# Patient Record
Sex: Male | Born: 2000 | Race: Asian | Hispanic: No | Marital: Single | Smoking: Never smoker
Health system: Southern US, Community
[De-identification: ages and names within clinical notes are randomized; demographics above are authoritative.]

## PROBLEM LIST (undated history)

## (undated) DIAGNOSIS — J302 Other seasonal allergic rhinitis: Secondary | ICD-10-CM

## (undated) DIAGNOSIS — R109 Unspecified abdominal pain: Secondary | ICD-10-CM

## (undated) DIAGNOSIS — K5909 Other constipation: Secondary | ICD-10-CM

## (undated) DIAGNOSIS — F419 Anxiety disorder, unspecified: Secondary | ICD-10-CM

## (undated) HISTORY — PX: CIRCUMCISION: SUR203

## (undated) HISTORY — DX: Unspecified abdominal pain: R10.9

## (undated) HISTORY — PX: ADENOIDECTOMY: SUR15

## (undated) HISTORY — DX: Other constipation: K59.09

---
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
## 2022-01-27 ENCOUNTER — Encounter: Payer: Self-pay | Admitting: Family Medicine

## 2022-01-27 ENCOUNTER — Ambulatory Visit: Payer: 59 | Admitting: Family Medicine

## 2022-01-27 ENCOUNTER — Telehealth: Payer: Self-pay

## 2022-01-27 VITALS — BP 114/66 | HR 67 | Temp 97.8°F | Resp 16 | Ht 66.75 in | Wt 115.0 lb

## 2022-01-27 DIAGNOSIS — Z8719 Personal history of other diseases of the digestive system: Secondary | ICD-10-CM

## 2022-01-27 DIAGNOSIS — F840 Autistic disorder: Secondary | ICD-10-CM

## 2022-01-27 DIAGNOSIS — F411 Generalized anxiety disorder: Secondary | ICD-10-CM

## 2022-01-27 DIAGNOSIS — J309 Allergic rhinitis, unspecified: Secondary | ICD-10-CM

## 2022-01-27 DIAGNOSIS — F439 Reaction to severe stress, unspecified: Secondary | ICD-10-CM

## 2022-01-27 MED ORDER — SERTRALINE HCL 25 MG PO TABS: 25.0000 mg | ORAL_TABLET | Freq: Every day | ORAL | 3 refills | Status: AC

## 2022-01-27 NOTE — Progress Notes (Signed)
Subjective:  Patient ID: Raymond Wong, male    DOB: 08/30/2001  Age: 21 y.o. MRN: [id number]  CC:  Chief Complaint  Patient presents with   Establish Care    Pt has not seen a Dr In many years Gad7=9    HPI Raymond Wong presents for    New patient to establish care.Prior patient of Dr. Georgann Housekeeper. Here with mother today.  Father is nephrologist, mother trained as Sports administrator in [place].   Allergic Rhinitis: Allegra in past, no current meds. No recent use. No symptoms yet this year.  Constipation in past. BM mostly daily. Usually normal stools. Has miralax if needed. Some vegetables, fruits with most meals, some whole grain.  No recent need for Nexium - prior GERD, no recent symptoms.   Hx of autism spectrum disorder. Has taken adderall for ADD - did not tolerate - fidgety. Worse focus. Counseling: Lupita Leash at [facility]. Last appt in February. Doing well. Stress at times. Meltdown every few months with stress - emotional, breakdown. some shouting during these episodes. Trouble regulating emotion at times. Guanfacine in past - intermittent short term benefit. Off meds past 6 months - stopped on own.  Freshman at [employer], environmental studies. Grades doing well. Plan for sports medicine possibly.  No FH of depression, anxiety, bipolar D/o No manic symptoms.  Anger episodes in past. No suicidal ideation. No homicidal ideation. No legal issues.  Trouble interacting with friends at times, making friends.  Still playing tennis - competitively.   Depression screen [facility] 2/9 01/27/2022 10/01/2015  Decreased Interest 0 (No Data)  Down, Depressed, Hopeless 1 (No Data)  PHQ - 2 Score 1 -  Altered sleeping 0 -  Tired, decreased energy 1 -  Change in appetite 0 -  Feeling bad or failure about yourself  1 -  Trouble concentrating 1 -  Moving slowly or fidgety/restless 0 -  Suicidal thoughts 0 -  PHQ-9 Score 4 -   GAD 7 : Generalized Anxiety Score 01/27/2022  Nervous,  Anxious, on Edge 1  Control/stop worrying 2  Worry too much - different things 3  Trouble relaxing 1  Restless 0  Easily annoyed or irritable 1  Afraid - awful might happen 1  Total GAD 7 Score 9  Anxiety Difficulty Somewhat difficult       History Patient Active Problem List   Diagnosis Date Noted   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   Excessive gas 11/10/2011   Poor appetite 11/10/2011   Periumbilical abdominal pain    Chronic constipation    Past Medical History:  Diagnosis Date   Abdominal pain, recurrent    Anxiety    Chronic constipation    Seasonal allergies    Past Surgical History:  Procedure Laterality Date   ADENOIDECTOMY     CIRCUMCISION     ESOPHAGOGASTRODUODENOSCOPY  08/24/2012   Procedure: ESOPHAGOGASTRODUODENOSCOPY (EGD);  Surgeon: Jon Gills, MD;  Location: [facility] OR;  Service: Gastroenterology;  Laterality: N/A;   Allergies  Allergen Reactions   Dairy Aid [Tilactase]    Prior to Admission medications   Medication Sig Start Date End Date Taking? Authorizing Provider  Alpha-D-Galactosidase (BEANO PO) Take 3 tablets by mouth as needed (when eating gassy foods.).  Patient not taking: Reported on 01/27/2022    [provider]  Calcium Citrate-Vitamin D (CALCIUM + D PO) Take by mouth. Patient not taking: Reported on 01/27/2022    [provider]  esomeprazole (NEXIUM) 40 MG capsule Take 40 mg by mouth  daily at 12 noon. Patient not taking: Reported on 01/27/2022    [provider]  fexofenadine (ALLEGRA) 180 MG tablet Take 180 mg by mouth daily.  Patient not taking: Reported on 01/27/2022    [provider]  FIBER SELECT GUMMIES PO Take by mouth. Patient not taking: Reported on 01/27/2022    [provider]  ondansetron (ZOFRAN ODT) 4 MG disintegrating tablet Take 1 tablet (4 mg total) by mouth every 8 (eight) hours as needed for nausea or vomiting. Patient not taking: Reported on 01/27/2022 01/15/14   Ree Shay, MD   polyethylene glycol (MIRALAX / GLYCOLAX) packet Take 17 g by mouth daily. Patient not taking: Reported on 01/27/2022    [provider]   Social History   Socioeconomic History   Marital status: Single    Spouse name: Not on file   Number of children: Not on file   Years of education: Not on file   Highest education level: Not on file  Occupational History   Not on file  Tobacco Use   Smoking status: Never   Smokeless tobacco: Never  Substance and Sexual Activity   Alcohol use: Never   Drug use: Never   Sexual activity: Not Currently  Other Topics Concern   Not on file  Social History Narrative   Not on file   Social Determinants of Health   Financial Resource Strain: Not on file  Food Insecurity: Not on file  Transportation Needs: Not on file  Physical Activity: Not on file  Stress: Not on file  Social Connections: Not on file  Intimate Partner Violence: Not on file    Review of Systems Per HPI.   Objective:   Vitals:   01/27/22 0925  BP: 114/66  Pulse: 67  Resp: 16  Temp: 97.8 F (36.6 C)  TempSrc: Temporal  SpO2: 98%  Weight: 115 lb (52.2 kg)  Height: 5' 6.75" (1.695 m)     Physical Exam Vitals reviewed.  Constitutional:      Appearance: He is well-developed.  HENT:     Head: Normocephalic and atraumatic.  Neck:     Vascular: No carotid bruit or JVD.  Cardiovascular:     Rate and Rhythm: Normal rate and regular rhythm.     Heart sounds: Normal heart sounds. No murmur heard. Pulmonary:     Effort: Pulmonary effort is normal.     Breath sounds: Normal breath sounds. No rales.  Musculoskeletal:     Right lower leg: No edema.     Left lower leg: No edema.  Skin:    General: Skin is warm and dry.  Neurological:     Mental Status: He is alert and oriented to person, place, and time.  Psychiatric:        Mood and Affect: Mood normal.        Behavior: Behavior normal.   52 minutes spent during visit, including chart review,  discussion f prior medical history and meds from pediatrician, counseling and assimilation of information, exam, discussion of plan, and chart completion.    Assessment & Plan:  Raymond Wong is a 21 y.o. male . Anxiety state - Plan: sertraline (ZOLOFT) 25 MG tablet, Ambulatory referral to Psychiatry, CANCELED: Ambulatory referral to Psychiatry Situational stress - Plan: sertraline (ZOLOFT) 25 MG tablet, Ambulatory referral to Psychiatry, CANCELED: Ambulatory referral to Psychiatry Autism spectrum disorder - Plan: Ambulatory referral to Psychiatry, CANCELED: Ambulatory referral to Psychiatry  - anxiety symptoms as above, suspect multifactorial with autism spectrum  disorder, possible underlying ADD, and anxiety disorder with adjusting to stress and changes with college. Intolerant to prior ADD med trials.   - refer to psychiatry. Start low dose zoloft for anxiety symptoms. Considered wellbutrin for possible ADD coverage as well, but with anxiety component, decided initially on SSRI to not worsen symptoms.  Continue follow up with therapist. Recheck in 1 month.    History of constipation  - stable, continue preventive measures, option of miralax.   Allergic rhinitis, unspecified seasonality, unspecified trigger  -start otc antihistamine, flonase as allergy season starts, with rtc precautions. Correct nasal spray use discussed.   Meds ordered this encounter  Medications   sertraline (ZOLOFT) 25 MG tablet    Sig: Take 1 tablet (25 mg total) by mouth daily.    Dispense:  30 tablet    Refill:  3   Patient Instructions  I recommend starting either Allegra or flonase soon as allergy season has started.   Miralax if needed for constipation, but try to drink 48-64 ounces of water per day, keep up the good work with fiber in diet.   If heartburn returns, can take omeprazole over the counter, but follow up to discuss further.   Try low dose sertraline for anxiety. Follow up with your  psychologist to discuss anxiety and possible symptoms associated with spectrum. See info below on managing anxiety/stress. Exercise is helpful.   Managing Stress, Teen Stress is the physical, mental, and emotional experience that a person has when facing a challenge in life. Many people think that stress is always bad, but most stress is just a normal part of life. Stress is only bad when you struggle to manage it, or when you think that you cannot deal with it. Learning to live with stress is an important life skill. Stress can be positive ("good stress"), such as stress related to a vacation, a competition, or a date. Good stress can make you feel energized and motivated to do your best. Stress can be negative ("bad stress") when it is caused by something like a big test, a fight with a friend, or bullying. How to recognize stress If you are experiencing bad stress, you may: Feel moody, angry, or anxious and tense. Have problems concentrating, performing in school, eating, or sleeping. Feel like you have too much to handle (overwhelmed). Fight with others or have problems with friends. Express anger suddenly (have outbursts). Feel the need to use alcohol or drugs, including cigarettes, to help you deal with stress. Have thoughts about harming yourself. Want to stay away from friends or family (isolate yourself). Follow these instructions at home: Lifestyle Eat a healthy diet, exercise regularly, and get plenty of sleep. Do not use drugs. Do not drink alcohol. Do not use any products that contain nicotine or tobacco. These products include cigarettes, chewing tobacco, and vaping devices, such as e-cigarettes. If you need help quitting, ask your health care provider. General instructions Ask for help when you need it. A trusted adult such as a family member, Runner, broadcasting/film/video, or school counselor may be able to suggest some ways to deal with stress. Find ways to calm yourself when you feel stressed,  such as: Doing deep breathing. Listening to music. Talking with someone you trust. Learning mindfulness. Learn to regularly release stress and relax through hobbies, exercise, or telling others how you feel. Be honest with yourself about times when you are struggling with stress. Do not just wait for the feeling to go away or the situation to  get better on its own. Take over-the-counter and prescription medicines only as told by your health care provider. Keep all follow-up visits. This is important. Where to find support You can find support for managing stress from: Your health care provider. A school counselor. A therapist who specializes in working with teens and families. Friends or support groups at school. Where to find more information You can find more information about managing stress from: American Psychological Association: DiceTournament.ca Contact a health care provider if: You feel depressed. You are not doing well in school, or you lose interest in school. Your stress is extreme and keeps getting worse. You withdraw from friends and normal activities. You have extreme mood changes. You start to use alcohol or drugs. Get help right away if: You have thoughts of hurting yourself or others. Get help right awayif you feel like you may hurt yourself or others, or have thoughts about taking your own life. Go to your nearest emergency room or: Call 911. Call the National Suicide Prevention Lifeline at (937)752-7874 or 988 in the [place] This is open 24 hours a day. Text the Crisis Text Line at 407-455-8757. Summary Stress is the physical, mental, and emotional experience that a person has when facing a challenge in life. Some stress is positive, and other kinds of stress may be negative. Ask for help when you need it. A trusted adult such as a family member, Runner, broadcasting/film/video, or school counselor may be able to suggest some ways to deal with stress. Practice good self-care by eating well,  exercising, relaxing, and getting the support that you need. Be honest with yourself about times when you are struggling with stress. Do not just wait and hope that the feeling will go away. This information is not intended to replace advice given to you by your health care provider. Make sure you discuss any questions you have with your health care provider. Document Revised: 06/03/2021 Document Reviewed: 06/01/2021 Elsevier Patient Education  2022 Elsevier Inc.   Managing Anxiety, Teen After being diagnosed with anxiety, you may be relieved to know why you have felt or behaved a certain way. You may also feel overwhelmed about the treatment ahead and what it will mean for your life. By learning how to manage short-term stress and how to live with anxiety you will feel more self-assured. With care and support, you can manage this condition. How to manage lifestyle changes Managing stress and anxiety Stress is your body's reaction to life changes and events, both good and bad. When you are faced with something exciting or potentially dangerous, your body responds by preparing to fight or run away. This response, called the fight-or-flight response, is a normal response to stress. When your brain starts this response, it tells your body to move the blood faster and to prepare for the demands of the expected challenge. When this happens, you may experience: A faster heart rate than usual. Blood flowing to the large muscles. A feeling of tension and focus. Stress can last a few hours but usually goes away after the triggering event ends. If the effects last a long time, or if you are worrying a lot about things you cannot control, it is likely that your stress has led to anxiety. Although stress can play a major role in anxiety, it is not the same as anxiety. Anxiety is more complicated to manage and often requires treatment. Stress does play a part in causing anxiety, so it is important to learn how  to  manage stress more effectively. Talk with your health care provider or a counselor to learn more about reducing anxiety and stress. He or she may suggest some ways to reduce tension (tension reduction techniques), such as: Music therapy. Spend time creating or listening to music that you enjoy and that inspires you. Mindfulness-based meditation. Practice being aware of your normal breaths while not trying to control your breathing. It can be done while sitting or walking. Deep breathing. To do this, expand your stomach and inhale slowly through your nose. Hold your breath for 3-5 seconds. Then exhale slowly, letting your stomach muscles relax. Self-talk. Learn to notice and identify thought patterns that lead to anxiety reactions and changing those patterns to thoughts that feel peaceful. Muscle relaxation. Taking time to tense muscles in your body and then relaxing them. Visual imagery. This involves imagining or creating mental pictures to help you relax. Yoga. Through yoga poses, you can lower tension and promote relaxation. Choose a tension reduction technique that fits your lifestyle and personality. Techniques to reduce anxiety and tension take time and practice. Set aside 5-15 minutes a day to do them. Therapists can offer counseling for anxiety and training in these techniques. Medicines Medicines can help ease symptoms. Medicines for anxiety include: Antidepressant medicines. These are usually prescribed for long-term daily control. Anti-anxiety medicines. These may be added in severe cases, especially when panic attacks occur. Medicines will be prescribed by a health care provider. When used together, medicines, psychotherapy, and tension reduction techniques may be the most effective treatment. Relationships Relationships can play a big part in helping you recover. Try to spend more time talking with a trusted friend or family member about your thoughts and feelings. Identify two or  three people who you think might help. How to recognize changes in your anxiety Everyone responds differently to treatment for anxiety. Recovery from anxiety happens when symptoms decrease and stop interfering with your daily activities at home or work. This may mean that you will start to: Have better concentration and focus. Sleep better. Be less irritable. Have more energy. Have improved memory. Spend far less time each day worrying about things that you cannot control. It is also important to recognize when your condition is getting worse. Contact your health care provider if your symptoms interfere with home, school, or work, and you feel like your condition is not improving. Follow these instructions at home: Activity Get enough exercise. Find activities that you enjoy, such as taking a walk, dancing, or playing a sport for fun. Most teens should exercise for at least one hour each day. If you cannot exercise for an hour, at least go outside for a walk. Get the right amount and quality of sleep. Most teens need 8.5-9.5 hours of sleep each night. Find an activity that helps you calm down, such as: Writing in a diary. Drawing or painting. Reading a book. Watching a funny movie. Lifestyle Spend time with friends, especially outdoors. Eat a healthy diet that includes plenty of vegetables, fruits, whole grains, low-fat dairy products, and lean protein. Do not eat a lot of foods that are high in solid fats, added sugars, or salt (sodium). Make choices that simplify your life. Do not use any products that contain nicotine or tobacco. These products include cigarettes, chewing tobacco, and vaping devices, such as e-cigarettes. If you need help quitting, ask your health care provider. Avoid caffeine, alcohol, and certain over-the-counter cold medicines. These may make you feel worse. Ask your pharmacist which medicines to  avoid. General instructions Take over-the-counter and prescription  medicines only as told by your health care provider. Keep all follow-up visits. This is important. Where to find support If methods for calming yourself are not working, or if your anxiety gets worse, you should get help from a mental health care provider. Talking with your health care provider or a counselor is not a sign of weakness. Certain types of counseling can be very helpful in treating anxiety. Talk with your health care provider or counselor about what treatment options are right for you. Where to find more information You may find that joining a support group helps you deal with your anxiety. The following sources can help you locate counselors or support groups near you: Mental Health America: www.mentalhealthamerica.net Anxiety and Depression Association of [place] (ADAA): ProgramCam.de The [employer] on Mental Illness (NAMI): www.nami.org Contact a health care provider if: You have a hard time staying focused or finishing daily tasks. You spend many hours a day feeling worried about everyday life. You become exhausted by worry. You start to have headaches or frequently feel tense. You develop chronic nausea or diarrhea. Get help right away if: You have a racing heart and shortness of breath. You have thoughts of hurting yourself or others. If you ever feel like you may hurt yourself or others, or have thoughts about taking your own life, get help right away. Go to your nearest emergency department or: Call your local emergency services (911 in the [place] Call a suicide crisis helpline, such as the National Suicide Prevention Lifeline at (669) 602-9758 or 988 in the [place] This is open 24 hours a day in the [place] Text the Crisis Text Line at (240)360-2124 (in the [place] Summary Stress can last just a few hours but usually goes away. When stress leads to anxiety, get help to find the right treatment. Certain techniques can help manage your tension and prevent it from shifting into  anxiety. When used together, medicines, psychotherapy, and tension reduction techniques may be the most effective treatment. Contact your health care provider if your symptoms interfere with your daily life and your condition does not improve. This information is not intended to replace advice given to you by your health care provider. Make sure you discuss any questions you have with your health care provider. Document Revised: 06/02/2021 Document Reviewed: 02/28/2021 Elsevier Patient Education  2022 Elsevier Inc.     Signed,   Meredith Staggers, MD [facility], [facility] 01/27/22 6:33 PM

## 2022-01-27 NOTE — Telephone Encounter (Signed)
Noted - will add to referral. Thanks.  ?

## 2022-01-27 NOTE — Telephone Encounter (Signed)
Pt mother called back with request in regards to therapy  ?

## 2022-01-27 NOTE — Patient Instructions (Addendum)
I recommend starting either Allegra or flonase soon as allergy season has started.   Miralax if needed for constipation, but try to drink 48-64 ounces of water per day, keep up the good work with fiber in diet.   If heartburn returns, can take omeprazole over the counter, but follow up to discuss further.   Try low dose sertraline for anxiety. Follow up with your psychologist to discuss anxiety and possible symptoms associated with spectrum. See info below on managing anxiety/stress. Exercise is helpful.   Managing Stress, Teen Stress is the physical, mental, and emotional experience that a person has when facing a challenge in life. Many people think that stress is always bad, but most stress is just a normal part of life. Stress is only bad when you struggle to manage it, or when you think that you cannot deal with it. Learning to live with stress is an important life skill. Stress can be positive ("good stress"), such as stress related to a vacation, a competition, or a date. Good stress can make you feel energized and motivated to do your best. Stress can be negative ("bad stress") when it is caused by something like a big test, a fight with a friend, or bullying. How to recognize stress If you are experiencing bad stress, you may: Feel moody, angry, or anxious and tense. Have problems concentrating, performing in school, eating, or sleeping. Feel like you have too much to handle (overwhelmed). Fight with others or have problems with friends. Express anger suddenly (have outbursts). Feel the need to use alcohol or drugs, including cigarettes, to help you deal with stress. Have thoughts about harming yourself. Want to stay away from friends or family (isolate yourself). Follow these instructions at home: Lifestyle Eat a healthy diet, exercise regularly, and get plenty of sleep. Do not use drugs. Do not drink alcohol. Do not use any products that contain nicotine or tobacco. These  products include cigarettes, chewing tobacco, and vaping devices, such as e-cigarettes. If you need help quitting, ask your health care provider. General instructions Ask for help when you need it. A trusted adult such as a family member, Runner, broadcasting/film/videoteacher, or school counselor may be able to suggest some ways to deal with stress. Find ways to calm yourself when you feel stressed, such as: Doing deep breathing. Listening to music. Talking with someone you trust. Learning mindfulness. Learn to regularly release stress and relax through hobbies, exercise, or telling others how you feel. Be honest with yourself about times when you are struggling with stress. Do not just wait for the feeling to go away or the situation to get better on its own. Take over-the-counter and prescription medicines only as told by your health care provider. Keep all follow-up visits. This is important. Where to find support You can find support for managing stress from: Your health care provider. A school counselor. A therapist who specializes in working with teens and families. Friends or support groups at school. Where to find more information You can find more information about managing stress from: American Psychological Association: DiceTournament.cawww.apa.org Contact a health care provider if: You feel depressed. You are not doing well in school, or you lose interest in school. Your stress is extreme and keeps getting worse. You withdraw from friends and normal activities. You have extreme mood changes. You start to use alcohol or drugs. Get help right away if: You have thoughts of hurting yourself or others. Get help right awayif you feel like you may hurt yourself  or others, or have thoughts about taking your own life. Go to your nearest emergency room or: Call 911. Call the National Suicide Prevention Lifeline at (352)308-2565 or 988 in the [place] This is open 24 hours a day. Text the Crisis Text Line at  936-662-5701. Summary Stress is the physical, mental, and emotional experience that a person has when facing a challenge in life. Some stress is positive, and other kinds of stress may be negative. Ask for help when you need it. A trusted adult such as a family member, Runner, broadcasting/film/video, or school counselor may be able to suggest some ways to deal with stress. Practice good self-care by eating well, exercising, relaxing, and getting the support that you need. Be honest with yourself about times when you are struggling with stress. Do not just wait and hope that the feeling will go away. This information is not intended to replace advice given to you by your health care provider. Make sure you discuss any questions you have with your health care provider. Document Revised: 06/03/2021 Document Reviewed: 06/01/2021 Elsevier Patient Education  2022 Elsevier Inc.   Managing Anxiety, Teen After being diagnosed with anxiety, you may be relieved to know why you have felt or behaved a certain way. You may also feel overwhelmed about the treatment ahead and what it will mean for your life. By learning how to manage short-term stress and how to live with anxiety you will feel more self-assured. With care and support, you can manage this condition. How to manage lifestyle changes Managing stress and anxiety Stress is your body's reaction to life changes and events, both good and bad. When you are faced with something exciting or potentially dangerous, your body responds by preparing to fight or run away. This response, called the fight-or-flight response, is a normal response to stress. When your brain starts this response, it tells your body to move the blood faster and to prepare for the demands of the expected challenge. When this happens, you may experience: A faster heart rate than usual. Blood flowing to the large muscles. A feeling of tension and focus. Stress can last a few hours but usually goes away after the  triggering event ends. If the effects last a long time, or if you are worrying a lot about things you cannot control, it is likely that your stress has led to anxiety. Although stress can play a major role in anxiety, it is not the same as anxiety. Anxiety is more complicated to manage and often requires treatment. Stress does play a part in causing anxiety, so it is important to learn how to manage stress more effectively. Talk with your health care provider or a counselor to learn more about reducing anxiety and stress. He or she may suggest some ways to reduce tension (tension reduction techniques), such as: Music therapy. Spend time creating or listening to music that you enjoy and that inspires you. Mindfulness-based meditation. Practice being aware of your normal breaths while not trying to control your breathing. It can be done while sitting or walking. Deep breathing. To do this, expand your stomach and inhale slowly through your nose. Hold your breath for 3-5 seconds. Then exhale slowly, letting your stomach muscles relax. Self-talk. Learn to notice and identify thought patterns that lead to anxiety reactions and changing those patterns to thoughts that feel peaceful. Muscle relaxation. Taking time to tense muscles in your body and then relaxing them. Visual imagery. This involves imagining or creating mental pictures to help  you relax. Yoga. Through yoga poses, you can lower tension and promote relaxation. Choose a tension reduction technique that fits your lifestyle and personality. Techniques to reduce anxiety and tension take time and practice. Set aside 5-15 minutes a day to do them. Therapists can offer counseling for anxiety and training in these techniques. Medicines Medicines can help ease symptoms. Medicines for anxiety include: Antidepressant medicines. These are usually prescribed for long-term daily control. Anti-anxiety medicines. These may be added in severe cases, especially  when panic attacks occur. Medicines will be prescribed by a health care provider. When used together, medicines, psychotherapy, and tension reduction techniques may be the most effective treatment. Relationships Relationships can play a big part in helping you recover. Try to spend more time talking with a trusted friend or family member about your thoughts and feelings. Identify two or three people who you think might help. How to recognize changes in your anxiety Everyone responds differently to treatment for anxiety. Recovery from anxiety happens when symptoms decrease and stop interfering with your daily activities at home or work. This may mean that you will start to: Have better concentration and focus. Sleep better. Be less irritable. Have more energy. Have improved memory. Spend far less time each day worrying about things that you cannot control. It is also important to recognize when your condition is getting worse. Contact your health care provider if your symptoms interfere with home, school, or work, and you feel like your condition is not improving. Follow these instructions at home: Activity Get enough exercise. Find activities that you enjoy, such as taking a walk, dancing, or playing a sport for fun. Most teens should exercise for at least one hour each day. If you cannot exercise for an hour, at least go outside for a walk. Get the right amount and quality of sleep. Most teens need 8.5-9.5 hours of sleep each night. Find an activity that helps you calm down, such as: Writing in a diary. Drawing or painting. Reading a book. Watching a funny movie. Lifestyle Spend time with friends, especially outdoors. Eat a healthy diet that includes plenty of vegetables, fruits, whole grains, low-fat dairy products, and lean protein. Do not eat a lot of foods that are high in solid fats, added sugars, or salt (sodium). Make choices that simplify your life. Do not use any products that  contain nicotine or tobacco. These products include cigarettes, chewing tobacco, and vaping devices, such as e-cigarettes. If you need help quitting, ask your health care provider. Avoid caffeine, alcohol, and certain over-the-counter cold medicines. These may make you feel worse. Ask your pharmacist which medicines to avoid. General instructions Take over-the-counter and prescription medicines only as told by your health care provider. Keep all follow-up visits. This is important. Where to find support If methods for calming yourself are not working, or if your anxiety gets worse, you should get help from a mental health care provider. Talking with your health care provider or a counselor is not a sign of weakness. Certain types of counseling can be very helpful in treating anxiety. Talk with your health care provider or counselor about what treatment options are right for you. Where to find more information You may find that joining a support group helps you deal with your anxiety. The following sources can help you locate counselors or support groups near you: Mental Health America: www.mentalhealthamerica.net Anxiety and Depression Association of [place] (ADAA): ProgramCam.de [employer] on Mental Illness (NAMI): www.nami.org Contact a health care provider if:  You have a hard time staying focused or finishing daily tasks. You spend many hours a day feeling worried about everyday life. You become exhausted by worry. You start to have headaches or frequently feel tense. You develop chronic nausea or diarrhea. Get help right away if: You have a racing heart and shortness of breath. You have thoughts of hurting yourself or others. If you ever feel like you may hurt yourself or others, or have thoughts about taking your own life, get help right away. Go to your nearest emergency department or: Call your local emergency services (911 in the [place] Call a suicide crisis helpline, such as  the National Suicide Prevention Lifeline at (989)878-9351 or 988 in the [place] This is open 24 hours a day in the [place] Text the Crisis Text Line at 340-821-4671 (in the [place] Summary Stress can last just a few hours but usually goes away. When stress leads to anxiety, get help to find the right treatment. Certain techniques can help manage your tension and prevent it from shifting into anxiety. When used together, medicines, psychotherapy, and tension reduction techniques may be the most effective treatment. Contact your health care provider if your symptoms interfere with your daily life and your condition does not improve. This information is not intended to replace advice given to you by your health care provider. Make sure you discuss any questions you have with your health care provider. Document Revised: 06/02/2021 Document Reviewed: 02/28/2021 Elsevier Patient Education  2022 [employer].

## 2022-01-27 NOTE — Telephone Encounter (Signed)
Caller name:Tad Vaness  ? ?On DPR? :Yes ? ?Call back [phone] ? ?Provider they see: Neva Seat ? ?Reason for call:Pt mother is calling with Dr Leata Mouse  Psychiatrist this is a [facility] provider that she would like her son to go to  ? ?

## 2022-02-28 ENCOUNTER — Ambulatory Visit: Payer: 59 | Admitting: Family Medicine

## 2022-03-02 ENCOUNTER — Encounter: Payer: Self-pay | Admitting: Family Medicine

## 2022-03-02 ENCOUNTER — Ambulatory Visit (INDEPENDENT_AMBULATORY_CARE_PROVIDER_SITE_OTHER): Payer: 59 | Admitting: Family Medicine

## 2022-03-02 VITALS — BP 108/68 | HR 77 | Temp 98.0°F | Resp 17 | Ht 66.75 in | Wt 111.2 lb

## 2022-03-02 DIAGNOSIS — F411 Generalized anxiety disorder: Secondary | ICD-10-CM | POA: Diagnosis not present

## 2022-03-02 DIAGNOSIS — J029 Acute pharyngitis, unspecified: Secondary | ICD-10-CM

## 2022-03-02 DIAGNOSIS — J309 Allergic rhinitis, unspecified: Secondary | ICD-10-CM | POA: Diagnosis not present

## 2022-03-02 DIAGNOSIS — F439 Reaction to severe stress, unspecified: Secondary | ICD-10-CM

## 2022-03-02 NOTE — Patient Instructions (Addendum)
See information below on sore throat.  I do recommend trying the antihistamine and Flonase for possible allergies but if sore throat is not improving in the next 2 days would recommend strep testing.  Let me know by early Friday morning if not improving and we can try to schedule that day for testing. ? ?Keep follow-up as planned with psychiatry.  Follow-up with me in 11 months for a physical but let me know if there are questions sooner.  Take care.  Stable, now under the care of psychiatry with recent medication dose increase.  Anticipate improvement once he is on that dose.  Continue follow-up with psychiatry.  Stable ?

## 2022-03-02 NOTE — Progress Notes (Signed)
? ?Subjective:  ?Patient ID: Raymond Wong, male    DOB: 2001/09/04  Age: 21 y.o. MRN: [id number] ? ?CC:  ?Chief Complaint  ?Patient presents with  ? Anxiety  ?  Pt did follow up with psychiatry they increased dose zoloft, mother is finding out new dose   ? Sore Throat  ?  Pt notes sore throat starting yesterday notes no other sxs, sore throat has been bothersome.   ? ? ?HPI ?Raymond Wong presents for  ? ?Anxiety: ?With hx of ASD, discussed at 01/27/22 visit. Thought to be multifactorial with underlying autism spectrum disorder, possible underlying ADD and anxiety disorder with adjusting to stress and changes with college.  Intolerant to prior ADD med trials.  He was referred to psychiatry, started on low-dose Zoloft for anxiety symptoms.  Was seen by psychiatry with dosage of Zoloft increased 2 days ago. Has not yet started increased dose. Psychiatry follow up in 1 month.   ? ? ?  03/02/2022  ?  9:14 AM 01/27/2022  ?  9:28 AM  ?Depression screen PHQ 2/9  ?Decreased Interest 1 0  ?Down, Depressed, Hopeless 1 1  ?PHQ - 2 Score 2 1  ?Altered sleeping 0 0  ?Tired, decreased energy 1 1  ?Change in appetite 0 0  ?Feeling bad or failure about yourself  2 1  ?Trouble concentrating 1 1  ?Moving slowly or fidgety/restless 1 0  ?Suicidal thoughts 0 0  ?PHQ-9 Score 7 4  ? ? ?  03/02/2022  ?  9:11 AM 01/27/2022  ? 10:34 AM  ?GAD 7 : Generalized Anxiety Score  ?Nervous, Anxious, on Edge 1 1  ?Control/stop worrying 3 2  ?Worry too much - different things 3 3  ?Trouble relaxing 3 1  ?Restless 2 0  ?Easily annoyed or irritable 1 1  ?Afraid - awful might happen 1 1  ?Total GAD 7 Score 14 9  ?Anxiety Difficulty  Somewhat difficult  ? ?Sore throat ?Started yesterday am. Noted more during the day yesterday. Slight cough, dry cough. No associated fever, or nasal congestion, no HA/myalgia. Around others with stomach bug recently. No known contacts with strep throat.Tx: none.  ?History of allergic rhinitis and was started on  over-the-counter antihistamine, Flonase as needed at March 9 visit - not needing. ?Feels well other than sore throat.  ?Declines strep testing today. ? ?History ?Patient Active Problem List  ? Diagnosis Date Noted  ? 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 Excessive gas 11/10/2011  ? Poor appetite 11/10/2011  ? Periumbilical abdominal pain   ? Chronic constipation   ? ?Past Medical History:  ?Diagnosis Date  ? Abdominal pain, recurrent   ? Anxiety   ? Chronic constipation   ? Seasonal allergies   ? ?Past Surgical History:  ?Procedure Laterality Date  ? ADENOIDECTOMY    ? CIRCUMCISION    ? ESOPHAGOGASTRODUODENOSCOPY  08/24/2012  ? Procedure: ESOPHAGOGASTRODUODENOSCOPY (EGD);  Surgeon: Jon Gills, MD;  Location: [facility] OR;  Service: Gastroenterology;  Laterality: N/A;  ? ?Allergies  ?Allergen Reactions  ? Dairy Aid [Tilactase]   ? ?Prior to Admission medications   ?Medication Sig Start Date End Date Taking? Authorizing Provider  ?sertraline (ZOLOFT) 25 MG tablet Take 1 tablet (25 mg total) by mouth daily. 01/27/22  Yes Shade Flood, MD  ?Alpha-D-Galactosidase (BEANO PO) Take 3 tablets by mouth as needed (when eating gassy foods.).  ?Patient not taking: Reported on 01/27/2022    [provider]  ?Calcium Citrate-Vitamin D (CALCIUM + D PO)  Take by mouth. ?Patient not taking: Reported on 01/27/2022    [provider]  ?esomeprazole (NEXIUM) 40 MG capsule Take 40 mg by mouth daily at 12 noon. ?Patient not taking: Reported on 01/27/2022    [provider]  ?fexofenadine (ALLEGRA) 180 MG tablet Take 180 mg by mouth daily.  ?Patient not taking: Reported on 01/27/2022    [provider]  ?FIBER SELECT GUMMIES PO Take by mouth. ?Patient not taking: Reported on 01/27/2022    [provider]  ?ondansetron (ZOFRAN ODT) 4 MG disintegrating tablet Take 1 tablet (4 mg total) by mouth every 8 (eight) hours as needed for nausea or vomiting. ?Patient not taking: Reported on 01/27/2022 01/15/14   Ree Shayeis,  Jamie, MD  ?polyethylene glycol (MIRALAX / GLYCOLAX) packet Take 17 g by mouth daily. ?Patient not taking: Reported on 01/27/2022    [provider]  ? ?Social History  ? ?Socioeconomic History  ? Marital status: Single  ?  Spouse name: Not on file  ? Number of children: Not on file  ? Years of education: Not on file  ? Highest education level: Not on file  ?Occupational History  ? Not on file  ?Tobacco Use  ? Smoking status: Never  ? Smokeless tobacco: Never  ?Substance and Sexual Activity  ? Alcohol use: Never  ? Drug use: Never  ? Sexual activity: Not Currently  ?Other Topics Concern  ? Not on file  ?Social History Narrative  ? Not on file  ? ?Social Determinants of Health  ? ?Financial Resource Strain: Not on file  ?Food Insecurity: Not on file  ?Transportation Needs: Not on file  ?Physical Activity: Not on file  ?Stress: Not on file  ?Social Connections: Not on file  ?Intimate Partner Violence: Not on file  ? ? ?Review of Systems ?Per HPI ? ?Objective:  ? ?Vitals:  ? 03/02/22 0909  ?BP: 108/68  ?Pulse: 77  ?Resp: 17  ?Temp: 98 ?F (36.7 ?C)  ?TempSrc: Temporal  ?SpO2: 98%  ?Weight: 111 lb 3.2 oz (50.4 kg)  ?Height: 5' 6.75" (1.695 m)  ? ? ? ?Physical Exam ?Vitals reviewed.  ?Constitutional:   ?   Appearance: He is well-developed.  ?HENT:  ?   Head: Normocephalic and atraumatic.  ?   Right Ear: Tympanic membrane, ear canal and external ear normal.  ?   Left Ear: Tympanic membrane, ear canal and external ear normal.  ?   Nose: No rhinorrhea.  ?   Mouth/Throat:  ?   Pharynx: Posterior oropharyngeal erythema (minimal posterior op, no exudate.) present. No oropharyngeal exudate.  ?Eyes:  ?   Conjunctiva/sclera: Conjunctivae normal.  ?   Pupils: Pupils are equal, round, and reactive to light.  ?Cardiovascular:  ?   Rate and Rhythm: Normal rate and regular rhythm.  ?   Heart sounds: Normal heart sounds. No murmur heard. ?Pulmonary:  ?   Effort: Pulmonary effort is normal.  ?   Breath sounds: Normal breath  sounds. No wheezing, rhonchi or rales.  ?Abdominal:  ?   Palpations: Abdomen is soft.  ?   Tenderness: There is no abdominal tenderness.  ?Musculoskeletal:  ?   Cervical back: Neck supple.  ?Lymphadenopathy:  ?   Cervical: Cervical adenopathy (Minimal discomfort over prominent AC node right and left) present.  ?Skin: ?   General: Skin is warm and dry.  ?   Findings: No rash.  ?Neurological:  ?   Mental Status: He is alert and oriented to person, place,  and time.  ?Psychiatric:     ?   Mood and Affect: Mood normal.     ?   Behavior: Behavior normal.  ? ? ? ? ? ?Assessment & Plan:  ?Raymond Wong is a 21 y.o. male . ?Anxiety state ?Situational stress ? - stable. Anticipate improvement with higher dose zoloft - continue follow up with psychiatry.  ? ?Sore throat ?Allergic rhinitis, unspecified seasonality, unspecified trigger ? - new problem. possible allergies vs virus vs strep. No fever, no exudate, min cough. He declined strep testing today. Will try allergy treatment initially, then follow up if not improved for strep testing in next 2 days. Rt precautions.  ? ?Cpe in 37months.  ? ?No orders of the defined types were placed in this encounter. ? ?Patient Instructions  ?See information below on sore throat.  I do recommend trying the antihistamine and Flonase for possible allergies but if sore throat is not improving in the next 2 days would recommend strep testing.  Let me know by early Friday morning if not improving and we can try to schedule that day for testing. ? ?Keep follow-up as planned with psychiatry.  Follow-up with me in 11 months for a physical but let me know if there are questions sooner.  Take care.  ? ? ? ?Signed,  ? ?Meredith Staggers, MD ?[facility], [facility] ?[facility] ?03/02/22 ?9:53 AM ? ? ?

## 2023-07-03 ENCOUNTER — Telehealth: Payer: Self-pay | Admitting: Family Medicine

## 2023-07-03 NOTE — Telephone Encounter (Signed)
Patient has not been seen since 04.2023; left voicemail to schedule annual physical

## 2024-06-03 ENCOUNTER — Ambulatory Visit (INDEPENDENT_AMBULATORY_CARE_PROVIDER_SITE_OTHER): Payer: Self-pay | Admitting: Family Medicine

## 2024-06-03 VITALS — BP 98/60 | HR 92 | Temp 98.7°F | Ht 66.5 in | Wt 110.2 lb

## 2024-06-03 DIAGNOSIS — Z23 Encounter for immunization: Secondary | ICD-10-CM

## 2024-06-03 DIAGNOSIS — Z114 Encounter for screening for human immunodeficiency virus [HIV]: Secondary | ICD-10-CM | POA: Diagnosis not present

## 2024-06-03 DIAGNOSIS — Z1159 Encounter for screening for other viral diseases: Secondary | ICD-10-CM

## 2024-06-03 DIAGNOSIS — Z0000 Encounter for general adult medical examination without abnormal findings: Secondary | ICD-10-CM | POA: Diagnosis not present

## 2024-06-03 NOTE — Progress Notes (Unsigned)
 Subjective:  Patient ID: Raymond Wong, male    DOB: 10-Jun-2001  Age: 23 y.o. MRN: [id number]  CC:  Chief Complaint  Patient presents with   Annual Exam    Pt is here for annual exam     HPI Raymond Wong presents for Annual Exam, here with parent.  Last visit with me in 2023 At [facility], transferred last year form [place]. Going well. Planning on fall classes at [employer]. Has helped with GPA.   Anxiety With history of ADD, ongoing, to some spectrum disorder and thought to have component of anxiety with adjusting to stress and changes with college.  Intolerant to prior ADD med trials.  Has been referred to psychiatry previously and started on low-dose Zoloft  for anxiety symptoms with increasing doses 1 last discussed in April 2023.  Still taking sertraline  - up to 50mg  - followed by psychiatry. Working ok. Irritability/anger has improved. Managing emotions better, prior meeting with therapist, doing ok without recent visits needed.  Feels like stable at current dose of meds.   Would like me to follow-up with sertraline  as he is on a stable dose, we discussed that would be reasonable unless any new symptoms and then would want him to follow-up with psychiatry.  He will send a MyChart message or call us  with the actual dose he is taking at home for refill.  Allergic rhinitis Treated with over-the-counter antihistamine and Flonase as needed previously - not needing recently.      03/02/2022    9:14 AM 01/27/2022    9:28 AM 10/01/2015   11:13 AM  Depression screen PHQ 2/9  Decreased Interest 1 0 --  Down, Depressed, Hopeless 1 1 --  PHQ - 2 Score 2 1   Altered sleeping 0 0   Tired, decreased energy 1 1   Change in appetite 0 0   Feeling bad or failure about yourself  2 1   Trouble concentrating 1 1   Moving slowly or fidgety/restless 1 0   Suicidal thoughts 0 0   PHQ-9 Score 7 4     Health Maintenance  Topic Date Due   HIV Screening  Never done   Hepatitis C Screening   Never done   COVID-19 Vaccine (3 - 2024-25 season) 07/23/2023   INFLUENZA VACCINE  06/21/2024   DTaP/Tdap/Td (7 - Td or Tdap) 06/03/2034   Pneumococcal Vaccine 15-31 Years old  Completed   Hepatitis B Vaccines  Completed   HPV VACCINES  Completed   Meningococcal B Vaccine  Completed   Tdap updated today. Immunization History  Administered Date(s) Administered   DTaP 08/01/2002, 01/15/2003, 02/26/2003, 12/28/2003, 09/13/2006   HIB (PRP-OMP) 11/11/2002, 01/15/2003, 03/15/2003, 12/10/2003   Hepatitis A 01/02/2006, 10/11/2006   Hepatitis B [birth year]10[birth year]2002, 11/11/2002, 03/15/2003   Hpv-Unspecified 01/22/2018, 04/04/2018, 10/01/2018   IPV 11/11/2002, 01/15/2003, 12/10/2003, 10/11/2006   MMR 12/10/2003, 09/13/2006   Meningococcal B, OMV 10/01/2018, 08/14/2019   Meningococcal Conjugate 02/14/2014, 10/01/2018   PFIZER Comirnaty(Gray Top)Covid-19 Tri-Sucrose Vaccine 02/22/2020, 10/14/2020   Pneumococcal Conjugate-13 11/11/2002, 01/15/2003, 03/18/2003, 09/16/2003   Tdap 02/14/2014, 06/03/2024   Typhoid Inactivated 03/26/2009   Varicella 09/16/2003, 09/13/2006    No results found. Glasses, optho within past year.   Dental: every 6 months.   Alcohol:none typically.   Tobacco: none, no vaping.   Exercise:some exercise. Has been playing tennis with new friend, once per week. FITT principle discussed.   Diet - vegetables, fruits, balanced diet.  Labs deferred today.   Has not become sexually active, STI  screening deferred.  Interviewed one-on-one without parent in room, denies acute concerns, denies SI, or questions about his health/development. 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   Excessive gas 11/10/2011   Poor appetite 11/10/2011   Periumbilical abdominal pain    Chronic constipation    Past Medical History:  Diagnosis Date   Abdominal pain,  recurrent    Anxiety    Chronic constipation    Seasonal allergies    Past Surgical History:  Procedure Laterality Date   ADENOIDECTOMY     CIRCUMCISION     ESOPHAGOGASTRODUODENOSCOPY  08/24/2012   Procedure: ESOPHAGOGASTRODUODENOSCOPY (EGD);  Surgeon: Fairy VEAR Gaskins, MD;  Location: [facility] OR;  Service: Gastroenterology;  Laterality: N/A;   Allergies  Allergen Reactions   Dairy Aid [Tilactase]    Prior to Admission medications   Medication Sig Start Date End Date Taking? Authorizing Provider  erythromycin ethylsuccinate (EES) 200 MG/5ML suspension Take 152 mg by mouth. 12/16/14  Yes [provider]  sertraline  (ZOLOFT ) 25 MG tablet Take 1 tablet (25 mg total) by mouth daily. 01/27/22  Yes Levora Reyes SAUNDERS, MD  simethicone (MYLICON) 80 MG chewable tablet Chew 40 mg by mouth. 01/18/14  Yes [provider]  Alpha-D-Galactosidase (BEANO PO) Take 3 tablets by mouth as needed (when eating gassy foods.).  Patient not taking: Reported on 01/27/2022    [provider]  Calcium Citrate-Vitamin D (CALCIUM + D PO) Take by mouth. Patient not taking: Reported on 01/27/2022    [provider]  esomeprazole (NEXIUM) 40 MG capsule Take 40 mg by mouth daily at 12 noon. Patient not taking: Reported on 01/27/2022    [provider]  fexofenadine (ALLEGRA) 180 MG tablet Take 180 mg by mouth daily.  Patient not taking: Reported on 01/27/2022    [provider]  FIBER SELECT GUMMIES PO Take by mouth. Patient not taking: Reported on 01/27/2022    [provider]  ondansetron  (ZOFRAN  ODT) 4 MG disintegrating tablet Take 1 tablet (4 mg total) by mouth every 8 (eight) hours as needed for nausea or vomiting. Patient not taking: Reported on 01/27/2022 01/15/14   Deis, Jamie, MD  polyethylene glycol (MIRALAX  / GLYCOLAX ) packet Take 17 g by mouth daily. Patient not taking: Reported on 01/27/2022    [provider]   Social History   Socioeconomic History    Marital status: Single    Spouse name: Not on file   Number of children: Not on file   Years of education: Not on file   Highest education level: Not on file  Occupational History   Not on file  Tobacco Use   Smoking status: Never   Smokeless tobacco: Never  Substance and Sexual Activity   Alcohol use: Never   Drug use: Never   Sexual activity: Not Currently  Other Topics Concern   Not on file  Social History Narrative   Not on file   Social Drivers of Health   Financial Resource Strain: Not on file  Food Insecurity: Not on file  Transportation Needs: Not on file  Physical Activity: Not on file  Stress: Not on file  Social Connections: Not on file  Intimate Partner Violence: Not on file    Review of Systems  13 point review of systems per patient health survey noted.  Negative other than as indicated above or in HPI.  Objective:   Vitals:   06/03/24 1430  BP: 98/60  Pulse: 92  Temp: 98.7 F (37.1 C)  SpO2: 97%  Weight: 110 lb 4 oz (50 kg)  Height: 5' 6.5 (1.689 m)   {Vitals History (Optional):23777}  Physical Exam Vitals reviewed.  Constitutional:      Appearance: He is well-developed.  HENT:     Head: Normocephalic and atraumatic.     Right Ear: External ear normal.     Left Ear: External ear normal.  Eyes:     Conjunctiva/sclera: Conjunctivae normal.     Pupils: Pupils are equal, round, and reactive to light.  Neck:     Thyroid: No thyromegaly.  Cardiovascular:     Rate and Rhythm: Normal rate and regular rhythm.     Heart sounds: Normal heart sounds.  Pulmonary:     Effort: Pulmonary effort is normal. No respiratory distress.     Breath sounds: Normal breath sounds. No wheezing.  Abdominal:     General: There is no distension.     Palpations: Abdomen is soft.     Tenderness: There is no abdominal tenderness.  Musculoskeletal:        General: No tenderness. Normal range of motion.     Cervical back: Normal range of motion and neck supple.   Lymphadenopathy:     Cervical: No cervical adenopathy.  Skin:    General: Skin is warm and dry.  Neurological:     Mental Status: He is alert and oriented to person, place, and time.     Deep Tendon Reflexes: Reflexes are normal and symmetric.  Psychiatric:        Behavior: Behavior normal.        Assessment & Plan:  Raymond Wong is a 23 y.o. male . Encounter for screening for HIV - Plan: CANCELED: HIV antibody (with reflex)  Need for tetanus booster - Plan: Tdap vaccine greater than or equal to 7yo IM  Need for hepatitis C screening test - Plan: CANCELED: Hepatitis C Antibody   No orders of the defined types were placed in this encounter.  Patient Instructions  Thanks for coming in today.  Let me know what dose of sertraline  you are taking I can send in a refill.  Follow-up with me in 1 year for a physical.  If any concerns or questions in meantime, please reach out.  Take care!  Preventive Care 57-62 Years Old, Male Preventive care refers to lifestyle choices and visits with your health care provider that can promote health and wellness. At this stage in your life, you may start seeing a primary care physician instead of a pediatrician for your preventive care. Preventive care visits are also called wellness exams. What can I expect for my preventive care visit? Counseling During your preventive care visit, your health care provider may ask about your: Medical history, including: Past medical problems. Family medical history. Current health, including: Home life and relationship well-being. Emotional well-being. Sexual activity and sexual health. Lifestyle, including: Alcohol, nicotine or tobacco, and drug use. Access to firearms. Diet, exercise, and sleep habits. Sunscreen use. Motor vehicle safety. Physical exam Your health care provider may check your: Height and weight. These may be used to calculate your BMI (body mass index). BMI is a measurement that  tells if you are at a healthy weight. Waist circumference. This measures the distance around your waistline. This measurement also tells if you are at a healthy weight and may help predict your risk of certain  diseases, such as type 2 diabetes and high blood pressure. Heart rate and blood pressure. Body temperature. Skin for abnormal spots. What immunizations do I need?  Vaccines are usually given at various ages, according to a schedule. Your health care provider will recommend vaccines for you based on your age, medical history, and lifestyle or other factors, such as travel or where you work. What tests do I need? Screening Your health care provider may recommend screening tests for certain conditions. This may include: Vision and hearing tests. Lipid and cholesterol levels. Hepatitis B test. Hepatitis C test. HIV (human immunodeficiency virus) test. STI (sexually transmitted infection) testing, if you are at risk. Tuberculosis skin test. Talk with your health care provider about your test results, treatment options, and if necessary, the need for more tests. Follow these instructions at home: Eating and drinking  Eat a healthy diet that includes fresh fruits and vegetables, whole grains, lean protein, and low-fat dairy products. Drink enough fluid to keep your urine pale yellow. Do not drink alcohol if: Your health care provider tells you not to drink. You are under the legal drinking age. In the [place] the legal drinking age is 93. If you drink alcohol: Limit how much you have to 0-2 drinks a day. Know how much alcohol is in your drink. In the [place] one drink equals one 12 oz bottle of beer (355 mL), one 5 oz glass of wine (148 mL), or one 1 oz glass of hard liquor (44 mL). Lifestyle Brush your teeth every morning and night with fluoride toothpaste. Floss one time each day. Exercise for at least 30 minutes 5 or more days of the week. Do not use any products that contain nicotine  or tobacco. These products include cigarettes, chewing tobacco, and vaping devices, such as e-cigarettes. If you need help quitting, ask your health care provider. Do not use drugs. If you are sexually active, practice safe sex. Use a condom or other form of protection to prevent STIs. Find healthy ways to manage stress, such as: Meditation, yoga, or listening to music. Journaling. Talking to a trusted person. Spending time with friends and family. Safety Always wear your seat belt while driving or riding in a vehicle. Do not drive: If you have been drinking alcohol. Do not ride with someone who has been drinking. When you are tired or distracted. While texting. If you have been using any mind-altering substances or drugs. Wear a helmet and other protective equipment during sports activities. If you have firearms in your house, make sure you follow all gun safety procedures. Seek help if you have been bullied, physically abused, or sexually abused. Use the internet responsibly to avoid dangers, such as online bullying and online sex predators. What's next? Go to your health care provider once a year for an annual wellness visit. Ask your health care provider how often you should have your eyes and teeth checked. Stay up to date on all vaccines. This information is not intended to replace advice given to you by your health care provider. Make sure you discuss any questions you have with your health care provider. Document Revised: 05/05/2021 Document Reviewed: 05/05/2021 Elsevier Patient Education  2024 Elsevier Inc.      Signed,   Reyes Pines, MD [facility], [facility] 06/03/24 3:26 PM

## 2024-06-03 NOTE — Patient Instructions (Addendum)
 Thanks for coming in today.  Let me know what dose of sertraline  you are taking I can send in a refill.  Follow-up with me in 1 year for a physical.  If any concerns or questions in meantime, please reach out.  Take care!  Preventive Care 10-22 Years Old, Male Preventive care refers to lifestyle choices and visits with your health care provider that can promote health and wellness. At this stage in your life, you may start seeing a primary care physician instead of a pediatrician for your preventive care. Preventive care visits are also called wellness exams. What can I expect for my preventive care visit? Counseling During your preventive care visit, your health care provider may ask about your: Medical history, including: Past medical problems. Family medical history. Current health, including: Home life and relationship well-being. Emotional well-being. Sexual activity and sexual health. Lifestyle, including: Alcohol, nicotine or tobacco, and drug use. Access to firearms. Diet, exercise, and sleep habits. Sunscreen use. Motor vehicle safety. Physical exam Your health care provider may check your: Height and weight. These may be used to calculate your BMI (body mass index). BMI is a measurement that tells if you are at a healthy weight. Waist circumference. This measures the distance around your waistline. This measurement also tells if you are at a healthy weight and may help predict your risk of certain diseases, such as type 2 diabetes and high blood pressure. Heart rate and blood pressure. Body temperature. Skin for abnormal spots. What immunizations do I need?  Vaccines are usually given at various ages, according to a schedule. Your health care provider will recommend vaccines for you based on your age, medical history, and lifestyle or other factors, such as travel or where you work. What tests do I need? Screening Your health care provider may recommend screening tests for  certain conditions. This may include: Vision and hearing tests. Lipid and cholesterol levels. Hepatitis B test. Hepatitis C test. HIV (human immunodeficiency virus) test. STI (sexually transmitted infection) testing, if you are at risk. Tuberculosis skin test. Talk with your health care provider about your test results, treatment options, and if necessary, the need for more tests. Follow these instructions at home: Eating and drinking  Eat a healthy diet that includes fresh fruits and vegetables, whole grains, lean protein, and low-fat dairy products. Drink enough fluid to keep your urine pale yellow. Do not drink alcohol if: Your health care provider tells you not to drink. You are under the legal drinking age. In the [place] the legal drinking age is 21. If you drink alcohol: Limit how much you have to 0-2 drinks a day. Know how much alcohol is in your drink. In the [place] one drink equals one 12 oz bottle of beer (355 mL), one 5 oz glass of wine (148 mL), or one 1 oz glass of hard liquor (44 mL). Lifestyle Brush your teeth every morning and night with fluoride toothpaste. Floss one time each day. Exercise for at least 30 minutes 5 or more days of the week. Do not use any products that contain nicotine or tobacco. These products include cigarettes, chewing tobacco, and vaping devices, such as e-cigarettes. If you need help quitting, ask your health care provider. Do not use drugs. If you are sexually active, practice safe sex. Use a condom or other form of protection to prevent STIs. Find healthy ways to manage stress, such as: Meditation, yoga, or listening to music. Journaling. Talking to a trusted person. Spending time with  friends and family. Safety Always wear your seat belt while driving or riding in a vehicle. Do not drive: If you have been drinking alcohol. Do not ride with someone who has been drinking. When you are tired or distracted. While texting. If you have been  using any mind-altering substances or drugs. Wear a helmet and other protective equipment during sports activities. If you have firearms in your house, make sure you follow all gun safety procedures. Seek help if you have been bullied, physically abused, or sexually abused. Use the internet responsibly to avoid dangers, such as online bullying and online sex predators. What's next? Go to your health care provider once a year for an annual wellness visit. Ask your health care provider how often you should have your eyes and teeth checked. Stay up to date on all vaccines. This information is not intended to replace advice given to you by your health care provider. Make sure you discuss any questions you have with your health care provider. Document Revised: 05/05/2021 Document Reviewed: 05/05/2021 Elsevier Patient Education  2024 [employer].

## 2025-06-04 ENCOUNTER — Encounter: Admitting: Family Medicine
